# Patient Record
Sex: Female | Born: 1995 | Race: White | Hispanic: No | Marital: Single | Smoking: Never smoker
Health system: Southern US, Community
[De-identification: ages and names within clinical notes are randomized; demographics above are authoritative.]

## PROBLEM LIST (undated history)

## (undated) DIAGNOSIS — F32A Depression, unspecified: Secondary | ICD-10-CM

## (undated) DIAGNOSIS — F329 Major depressive disorder, single episode, unspecified: Secondary | ICD-10-CM

## (undated) DIAGNOSIS — T783XXA Angioneurotic edema, initial encounter: Secondary | ICD-10-CM

## (undated) HISTORY — PX: TYMPANOPLASTY: SHX33

## (undated) HISTORY — DX: Angioneurotic edema, initial encounter: T78.3XXA

## (undated) HISTORY — PX: TONSILLECTOMY: SUR1361

## (undated) HISTORY — PX: ADENOIDECTOMY: SUR15

---
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
## 2015-05-21 ENCOUNTER — Ambulatory Visit (INDEPENDENT_AMBULATORY_CARE_PROVIDER_SITE_OTHER): Payer: BLUE CROSS/BLUE SHIELD | Admitting: Cardiology

## 2015-05-21 ENCOUNTER — Encounter: Payer: Self-pay | Admitting: *Deleted

## 2015-05-21 ENCOUNTER — Ambulatory Visit (INDEPENDENT_AMBULATORY_CARE_PROVIDER_SITE_OTHER): Payer: BLUE CROSS/BLUE SHIELD

## 2015-05-21 ENCOUNTER — Encounter: Payer: Self-pay | Admitting: Cardiology

## 2015-05-21 VITALS — BP 108/70 | HR 87 | Ht 65.0 in | Wt 224.2 lb

## 2015-05-21 DIAGNOSIS — R55 Syncope and collapse: Secondary | ICD-10-CM

## 2015-05-21 MED ORDER — DILTIAZEM HCL ER COATED BEADS 120 MG PO CP24: 120.0000 mg | ORAL_CAPSULE | Freq: Every day | ORAL | Status: DC

## 2015-05-21 NOTE — Progress Notes (Signed)
Findings about with propanolol early was   Electrophysiology Office Note   Date:  05/21/2015   ID:  Stacy Ponce, DOB 09/29/1995, MRN [id number]  PCP:  Eartha InchBADGER,MICHAEL C, MD   Primary Electrophysiologist:  Regan LemmingWill Martin Adewale Pucillo, MD    No chief complaint on file.    History of Present Illness: Stacy Ponce is a 19 y.o. female who presents today for electrophysiology evaluation.   She is a [employer]ing [employer], he was first evaluated in October due to her dizziness, lightheadedness, and apparent syncopal events. During her visit in November, she was placed on propranolol and 8's IO patch was placed. On November 10, she became ill. Her monitor showed no significant episodes correlated with that time. She did have episodes of SVT that occurred that day but none during the time that she felt bad. She felt panicky and also hyperventilated and went to the emergency room.  She does say that she last passed out on Sunday. She says that she does not remember what happened before and but woke up him up on the ground. She says that she feels fine before the events and has no prodrome, and is disoriented after the events but that goes away quickly.   Today, she denies symptoms of palpitations, chest pain, shortness of breath, orthopnea, PND, lower extremity edema, claudication, dizziness, presyncope, syncope, bleeding, or neurologic sequela. The patient is tolerating medications without difficulties and is otherwise without complaint today.    Past Medical History  Diagnosis Date  . Depression    Past Surgical History  Procedure Laterality Date  . Tympanoplasty       Current Outpatient Prescriptions  Medication Sig Dispense Refill  . propranolol (INDERAL) 20 MG tablet Take 20 mg by mouth daily.  5   No current facility-administered medications for this visit.    Allergies:   Review of patient's allergies indicates no known allergies.   Social History:  The  patient  reports that she has been smoking.  She has never used smokeless tobacco. She reports that she does not drink alcohol.   Family History:  The patient's family history includes Diabetes in her sister; Heart attack in her father; Heart disease in her father; Leukemia in her mother.    ROS:  Please see the history of present illness.   Otherwise, review of systems is positive for general he is in the mother is is.   All other systems are reviewed and negative.    PHYSICAL EXAM: VS:  There were no vitals taken for this visit. , BMI There is no height or weight on file to calculate BMI. GEN: Well nourished, well developed, in no acute distress HEENT: normal Neck: no JVD, carotid bruits, or masses Cardiac: she is nowillness.RRR; no murmurs, rubs, or gallops,no edema  Respiratory:  clear to auscultation bilaterally, normal work of breathing GI: soft, nontender, nondistended, + BS MS: no deformity or atrophy Skin: warm and dry Neuro:  Strength and sensation are intact Psych: euthymic mood, full affect  EKG:  EKG is ordered today. The ekg ordered today shows sinus rhythm, rate 87  Recent Labs: No results found for requested labs within last 365 days.    Lipid Panel  No results found for: CHOL, TRIG, HDL, CHOLHDL, VLDL, LDLCALC, LDLDIRECT   Wt Readings from Last 3 Encounters:  08/01/12 207 lb 3.2 oz (93.985 kg) (98 %*, Z = 2.10)   * Growth percentiles are based on CDC 2-20 Years data.  Other studies Reviewed: Additional studies/ records that were reviewed today include: TTE Review of the above records today demonstrates:  EF 60-65% and no valvular heart disease.   ASSESSMENT AND PLAN:  1.  Syncope: patient has been passing out for the last 2 months. She did wear a monitor which showed possibly sinus tachycardia versus SVT. I have offered her EP study versus repeat monitor to further characterize her heart rhythm. She would like to put on a monitor and see if there  are any abnormalities on this. I Cayman Kielbasa also switch her propranolol to diltiazem as this is a daily medication and she does not have to take it as often. Due to the fact that she has passed out I have told her that she is, by [facility], not able to drive for 6 months unless a cause of her syncope is found. We Loron Weimer have her follow-up after the monitor.    Current medicines are reviewed at length with the patient today.   The patient does not have concerns regarding her medicines.  The following changes were made today:  Switch propranolol to diltiazem  Labs/ tests ordered today include:  No orders of the defined types were placed in this encounter.     Disposition:   FU with Birtie Fellman 6 weeks  Signed, Hermes Wafer Jorja Loa, MD  05/21/2015 10:36 AM     [facility] [address] [place] [address] [phone] (office) [phone] (fax)

## 2015-05-21 NOTE — Patient Instructions (Addendum)
Medication Instructions:  Your physician has recommended you make the following change in your medication: 1) START Diltiazem 120 mg daily  Labwork: None ordered  Testing/Procedures: Your physician has recommended that you wear an event monitor. Event monitors are medical devices that record the heart's electrical activity. Doctors most often us these monitors to diagnose arrhythmias. Arrhythmias are problems with the speed or rhythm of the heartbeat. The monitor is a small, portable device. You can wear one while you do your normal daily activities. This is usually used to diagnose what is causing palpitations/syncope (passing out).  Follow-Up: Your physician recommends that you schedule a follow-up appointment in: 6-8 weeks with Dr. Elberta Fortisamnitz.  If you need a refill on your cardiac medications before your next appointment, please call your pharmacy.  Thank you for choosing [facility]!!   Dory HornSherri Jonita Hirota, RN [phone]  Cardiac Event Monitoring A cardiac event monitor is a small recording device used to help detect abnormal heart rhythms (arrhythmias). The monitor is used to record heart rhythm when noticeable symptoms such as the following occur:  Fast heartbeats (palpitations), such as heart racing or fluttering.  Dizziness.  Fainting or light-headedness.  Unexplained weakness. The monitor is wired to two electrodes placed on your chest. Electrodes are flat, sticky disks that attach to your skin. The monitor can be worn for up to 30 days. You will wear the monitor at all times, except when bathing.  HOW TO USE YOUR CARDIAC EVENT MONITOR A technician will prepare your chest for the electrode placement. The technician will show you how to place the electrodes, how to work the monitor, and how to replace the batteries. Take time to practice using the monitor before you leave the office. Make sure you understand how to send the information from the monitor to your health care  provider. This requires a telephone with a landline, not a cell phone. You need to:  Wear your monitor at all times, except when you are in water:  Do not get the monitor wet.  Take the monitor off when bathing. Do not swim or use a hot tub with it on.  Keep your skin clean. Do not put body lotion or moisturizer on your chest.  Change the electrodes daily or any time they stop sticking to your skin. You might need to use tape to keep them on.  It is possible that your skin under the electrodes could become irritated. To keep this from happening, try to put the electrodes in slightly different places on your chest. However, they must remain in the area under your left breast and in the upper right section of your chest.  Make sure the monitor is safely clipped to your clothing or in a location close to your body that your health care provider recommends.  Press the button to record when you feel symptoms of heart trouble, such as dizziness, weakness, light-headedness, palpitations, thumping, shortness of breath, unexplained weakness, or a fluttering or racing heart. The monitor is always on and records what happened slightly before you pressed the button, so do not worry about being too late to get good information.  Keep a diary of your activities, such as walking, doing chores, and taking medicine. It is especially important to note what you were doing when you pushed the button to record your symptoms. This will help your health care provider determine what might be contributing to your symptoms. The information stored in your monitor will be reviewed by your health care  provider alongside your diary entries.  Send the recorded information as recommended by your health care provider. It is important to understand that it will take some time for your health care provider to process the results.  Change the batteries as recommended by your health care provider. SEEK IMMEDIATE MEDICAL CARE IF:     You have chest pain.  You have extreme difficulty breathing or shortness of breath.  You develop a very fast heartbeat that persists.  You develop dizziness that does not go away.  You faint or constantly feel you are about to faint.   This information is not intended to replace advice given to you by your health care provider. Make sure you discuss any questions you have with your health care provider.   Document Released: 03/02/2008 Document Revised: 06/14/2014 Document Reviewed: 11/20/2012 Elsevier Interactive Patient Education [employer]

## 2015-06-16 ENCOUNTER — Telehealth: Payer: Self-pay | Admitting: *Deleted

## 2015-06-16 NOTE — Telephone Encounter (Signed)
Monitor report received from 06/14/15. Reviewed with Kathi LudwigLori Gerhard FLEX APP today. Rec patient have sooner f/u with Dr. Elberta Fortisamnitz. Print out left with Dr. Gershon Craneamnitz's nurse.  Called patient. She has not had any recent syncopal episodes. Appointment rescheduled for next Monday 06/23/15 with Dr. Elberta Fortisamnitz.

## 2015-06-23 ENCOUNTER — Encounter: Payer: Self-pay | Admitting: Cardiology

## 2015-06-23 ENCOUNTER — Ambulatory Visit (INDEPENDENT_AMBULATORY_CARE_PROVIDER_SITE_OTHER): Payer: BLUE CROSS/BLUE SHIELD | Admitting: Cardiology

## 2015-06-23 VITALS — BP 120/70 | HR 103 | Ht 65.0 in | Wt 232.4 lb

## 2015-06-23 DIAGNOSIS — I471 Supraventricular tachycardia: Secondary | ICD-10-CM

## 2015-06-23 NOTE — Patient Instructions (Signed)
Medication Instructions:  Your physician recommends that you continue on your current medications as directed. Please refer to the Current Medication list given to you today.  Labwork: None ordered  Testing/Procedures: Your physician has recommended that you have a tilt table test. This test is sometimes used to help determine the cause of fainting spells. You lie on a table that moves from a lying down to an upright position. The change in position can bring on loss of consciousness. The doctor monitors your symptoms, heart rate, EKG, and blood pressure throughout the test. The doctor also may give you a medicine and then monitor your response to the medicine. This is done in the hospital and usually takes half of a day to complete the procedure.   Jeronica Stlouis, RN will call to arrange testing.   Your physician has recommended that you have an SVT ablation. Catheter ablation is a medical procedure used to treat some cardiac arrhythmias (irregular heartbeats). During catheter ablation, a long, thin, flexible tube is put into a blood vessel in your groin (upper thigh), or neck. This tube is called an ablation catheter. It is then guided to your heart through the blood vessel. Radio frequency waves destroy small areas of heart tissue where abnormal heartbeats may cause an arrhythmia to start.  Vidalia Serpas, RN will call you to arrange this, depending on what tilt table testing shows.  Follow-Up: To be determined once tilt table/ablation is arranged.  Any Other Special Instructions Will Be Listed Below (If Applicable).  If you need a refill on your cardiac medications before your next appointment, please call your pharmacy.  Thank you for choosing [facility]!!   Dory HornSherri Deklen Popelka, RN [phone]

## 2015-06-23 NOTE — Progress Notes (Signed)
Electrophysiology Office Note   Date:  06/23/2015   ID:  Stacy Ponce, DOB 1995-06-15, MRN [id number]  PCP:  Eartha Inch, MD   Primary Electrophysiologist:  Regan Lemming, MD    No chief complaint on file.    History of Present Illness: Stacy Ponce is a 20 y.o. female who presents today for electrophysiology evaluation.   She presented in December with an episode of syncope when she was walking.  She had no prodrome at that time.  She returns today after wearing a 30 day monitor. She had some dizziness while wearing the monitor but no symptoms similar to her syncope.  She was called a few times with fast heart rates but was not symptomatic during these times.  She did have an episode of SVT with HR of 170 bpm.  Today, she denies symptoms of palpitations, chest pain, shortness of breath, orthopnea, PND, lower extremity edema, claudication, dizziness, presyncope, syncope, bleeding, or neurologic sequela. The patient is tolerating medications without difficulties and is otherwise without complaint today.    Past Medical History  Diagnosis Date  . Depression    Past Surgical History  Procedure Laterality Date  . Tympanoplasty       Current Outpatient Prescriptions  Medication Sig Dispense Refill  . diltiazem (CARDIZEM CD) 120 MG 24 hr capsule Take 1 capsule (120 mg total) by mouth daily. 30 capsule 3   No current facility-administered medications for this visit.    Allergies:   Review of patient's allergies indicates no known allergies.   Social History:  The patient  reports that she has been smoking.  She has never used smokeless tobacco. She reports that she does not drink alcohol.   Family History:  The patient's family history includes Diabetes in her sister; Heart attack in her father; Heart disease in her father; Leukemia in her mother.    ROS:  Please see the history of present illness.   All other systems are reviewed and negative.     PHYSICAL EXAM: VS:  BP 120/70 mmHg  Pulse 103  Ht 5\' 5"  (1.651 m)  Wt 232 lb 6.4 oz (105.416 kg)  BMI 38.67 kg/m2  SpO2 99% , BMI Body mass index is 38.67 kg/(m^2). GEN: Well nourished, well developed, in no acute distress HEENT: normal Neck: no JVD, carotid bruits, or masses Cardiac: RRR; no murmurs, rubs, or gallops,no edema  Respiratory:  clear to auscultation bilaterally, normal work of breathing GI: soft, nontender, nondistended, + BS MS: no deformity or atrophy Skin: warm and dry Neuro:  Strength and sensation are intact Psych: euthymic mood, full affect  EKG:  EKG is not ordered today.   Recent Labs: No results found for requested labs within last 365 days.    Lipid Panel  No results found for: CHOL, TRIG, HDL, CHOLHDL, VLDL, LDLCALC, LDLDIRECT   Wt Readings from Last 3 Encounters:  06/23/15 232 lb 6.4 oz (105.416 kg) (99 %*, Z = 2.35)  05/21/15 224 lb 3.2 oz (101.696 kg) (99 %*, Z = 2.26)  08/01/12 207 lb 3.2 oz (93.985 kg) (98 %*, Z = 2.10)   * Growth percentiles are based on CDC 2-20 Years data.      Other studies Reviewed: Additional studies/ records that were reviewed today include: Event monitor 05/21/15 Review of the above records today demonstrates:  Sinus rhythm with sinus tachycardia rate 150 Episode of SVT with rates 170  ASSESSMENT AND PLAN:  1.  SVT: found on event monitor.  Unlikely the cause of her syncope as she was asymptomatic during the episode.  That being said, both her father and her would like this to be investigated, so we Arlander Gillen plan for EP study and possible ablation.  2.  Syncope: unfortunately, she had no symptoms of syncope while wearing the monitor.  She not able to drive as we have not found a reason for her episode.  She did as for a tilt table test, but I do not feel that this Aileene Lanum give the answer of her syncope as she did not have any evidence of neural mediation.  Jaksen Fiorella plan for ablation of her SVT, not able to drive for 6  months after her most recent episode of syncope.  Current medicines are reviewed at length with the patient today.   The patient does not have concerns regarding her medicines.  The following changes were made today:  none  Labs/ tests ordered today include:  No orders of the defined types were placed in this encounter.     Disposition:   FU with Rosemary Mossbarger post ablation  Signed, Cherrill Scrima Jorja LoaMartin Josias Tomerlin, MD  06/23/2015 3:37 PM     [facility] [address] [place] [address] [phone] (office) [phone] (fax)

## 2015-06-24 ENCOUNTER — Telehealth: Payer: Self-pay | Admitting: *Deleted

## 2015-06-24 ENCOUNTER — Encounter: Payer: Self-pay | Admitting: *Deleted

## 2015-06-24 NOTE — Telephone Encounter (Signed)
Called patient to inform her to obtain tilt table testing when she goes for her 2nd opinion on 07/14/15.  She wants to schedule SVT ablation for 3/3. Patient aware I will arrange and call her back.   lmtcb to review procedure instructions and schedule pre procedure lab exam.

## 2015-06-24 NOTE — Telephone Encounter (Signed)
F/u ° ° °Pt returning your call °

## 2015-06-24 NOTE — Telephone Encounter (Signed)
SVT ablation scheduled for 3/3. Pre procedure labs to be done in hospital the morning of the procedure.  Letter of instructions reviewed with patient and mailed to home address. Patient verbalized understanding and agreeable to plan.

## 2015-06-25 ENCOUNTER — Other Ambulatory Visit: Payer: Self-pay | Admitting: Cardiology

## 2015-07-01 ENCOUNTER — Telehealth: Payer: Self-pay | Admitting: Cardiology

## 2015-07-01 DIAGNOSIS — R55 Syncope and collapse: Secondary | ICD-10-CM

## 2015-07-01 NOTE — Telephone Encounter (Signed)
New message   Pt needs her referral for her Tilt test

## 2015-07-01 NOTE — Telephone Encounter (Signed)
Patient asking referral go to Roselie Awkward, MD at [facility] in [place], [place].  Office number [phone] Informed that I would take care of this referral tomorrow.

## 2015-07-11 ENCOUNTER — Ambulatory Visit: Payer: BLUE CROSS/BLUE SHIELD | Admitting: Cardiology

## 2015-07-24 NOTE — Telephone Encounter (Signed)
Followed up with patient to see how 2/6 appt went with cardiologist in [place]. Patient explains that they opted out of tilt table testing and are planning on proceeding with scheduled ablation next month with Dr. Elberta Fortis. Verified she received letter of instructions.

## 2015-08-08 ENCOUNTER — Ambulatory Visit (HOSPITAL_COMMUNITY)
Admission: RE | Admit: 2015-08-08 | Discharge: 2015-08-08 | Disposition: A | Discharge status: Home / Self Care | Payer: BLUE CROSS/BLUE SHIELD | Source: Ambulatory Visit | Attending: Cardiology | Admitting: Cardiology

## 2015-08-08 ENCOUNTER — Encounter (HOSPITAL_COMMUNITY)
Admission: RE | Disposition: A | Discharge status: Home / Self Care | Payer: Self-pay | Source: Ambulatory Visit | Attending: Cardiology

## 2015-08-08 DIAGNOSIS — I471 Supraventricular tachycardia: Secondary | ICD-10-CM | POA: Insufficient documentation

## 2015-08-08 DIAGNOSIS — F17210 Nicotine dependence, cigarettes, uncomplicated: Secondary | ICD-10-CM | POA: Diagnosis not present

## 2015-08-08 DIAGNOSIS — Z8249 Family history of ischemic heart disease and other diseases of the circulatory system: Secondary | ICD-10-CM | POA: Insufficient documentation

## 2015-08-08 DIAGNOSIS — F329 Major depressive disorder, single episode, unspecified: Secondary | ICD-10-CM | POA: Insufficient documentation

## 2015-08-08 HISTORY — PX: ELECTROPHYSIOLOGIC STUDY: SHX172A

## 2015-08-08 LAB — CBC
HEMATOCRIT: 34.4 % — AB (ref 36.0–46.0)
HEMOGLOBIN: 10.8 g/dL — AB (ref 12.0–15.0)
MCH: 22.6 pg — AB (ref 26.0–34.0)
MCHC: 31.4 g/dL (ref 30.0–36.0)
MCV: 72 fL — ABNORMAL LOW (ref 78.0–100.0)
Platelets: 243 10*3/uL (ref 150–400)
RBC: 4.78 MIL/uL (ref 3.87–5.11)
RDW: 14.5 % (ref 11.5–15.5)
WBC: 7 10*3/uL (ref 4.0–10.5)

## 2015-08-08 LAB — BASIC METABOLIC PANEL
Anion gap: 11 (ref 5–15)
BUN: 10 mg/dL (ref 6–20)
CHLORIDE: 105 mmol/L (ref 101–111)
CO2: 25 mmol/L (ref 22–32)
CREATININE: 0.97 mg/dL (ref 0.44–1.00)
Calcium: 9.4 mg/dL (ref 8.9–10.3)
GFR calc Af Amer: >60 mL/min (ref >60)
GFR calc non Af Amer: >60 mL/min (ref >60)
Glucose, Bld: 92 mg/dL (ref 65–99)
Potassium: 4 mmol/L (ref 3.5–5.1)
Sodium: 141 mmol/L (ref 135–145)

## 2015-08-08 LAB — PROTIME-INR
INR: 1.05 (ref 0.00–1.49)
Prothrombin Time: 13.9 seconds (ref 11.6–15.2)

## 2015-08-08 LAB — PREGNANCY, URINE: Preg Test, Ur: NEGATIVE

## 2015-08-08 SURGERY — A-FLUTTER/A-TACH/SVT ABLATION

## 2015-08-08 MED ORDER — HEPARIN (PORCINE) IN NACL 2-0.9 UNIT/ML-% IJ SOLN
INTRAMUSCULAR | Status: DC | PRN
  Administered 2015-08-08: 1000 mL

## 2015-08-08 MED ORDER — FENTANYL CITRATE (PF) 100 MCG/2ML IJ SOLN
INTRAMUSCULAR | Status: AC
  Filled 2015-08-08: qty 2

## 2015-08-08 MED ORDER — MIDAZOLAM HCL 5 MG/5ML IJ SOLN
INTRAMUSCULAR | Status: AC
  Filled 2015-08-08: qty 5

## 2015-08-08 MED ORDER — HEPARIN (PORCINE) IN NACL 2-0.9 UNIT/ML-% IJ SOLN
INTRAMUSCULAR | Status: AC
  Filled 2015-08-08: qty 500

## 2015-08-08 MED ORDER — MIDAZOLAM HCL 5 MG/5ML IJ SOLN
INTRAMUSCULAR | Status: DC | PRN
  Administered 2015-08-08 (×8): 1 mg via INTRAVENOUS

## 2015-08-08 MED ORDER — SODIUM CHLORIDE 0.9 % IV SOLN: 250.0000 mL | INTRAVENOUS | Status: DC | PRN

## 2015-08-08 MED ORDER — SODIUM CHLORIDE 0.9% FLUSH: 3.0000 mL | Freq: Two times a day (BID) | INTRAVENOUS | Status: DC

## 2015-08-08 MED ORDER — ISOPROTERENOL HCL 0.2 MG/ML IJ SOLN
INTRAMUSCULAR | Status: AC
  Filled 2015-08-08: qty 5

## 2015-08-08 MED ORDER — DILTIAZEM HCL ER COATED BEADS 120 MG PO CP24
120.0000 mg | ORAL_CAPSULE | Freq: Every day | ORAL | Status: DC
  Filled 2015-08-08: qty 1

## 2015-08-08 MED ORDER — ONDANSETRON HCL 4 MG/2ML IJ SOLN: 4.0000 mg | Freq: Four times a day (QID) | INTRAMUSCULAR | Status: DC | PRN

## 2015-08-08 MED ORDER — BUPIVACAINE HCL (PF) 0.25 % IJ SOLN
INTRAMUSCULAR | Status: AC
  Filled 2015-08-08: qty 60

## 2015-08-08 MED ORDER — FENTANYL CITRATE (PF) 100 MCG/2ML IJ SOLN
INTRAMUSCULAR | Status: DC | PRN
  Administered 2015-08-08 (×4): 25 ug via INTRAVENOUS

## 2015-08-08 MED ORDER — ISOPROTERENOL HCL 0.2 MG/ML IJ SOLN
1.0000 mg | INTRAVENOUS | Status: DC | PRN
  Administered 2015-08-08: 2 ug/min via INTRAVENOUS

## 2015-08-08 MED ORDER — ACETAMINOPHEN 325 MG PO TABS
650.0000 mg | ORAL_TABLET | ORAL | Status: DC | PRN
  Filled 2015-08-08: qty 2

## 2015-08-08 MED ORDER — BUPIVACAINE HCL (PF) 0.25 % IJ SOLN
INTRAMUSCULAR | Status: DC | PRN
  Administered 2015-08-08: 45 mL

## 2015-08-08 MED ORDER — SODIUM CHLORIDE 0.9% FLUSH: 3.0000 mL | INTRAVENOUS | Status: DC | PRN

## 2015-08-08 SURGICAL SUPPLY — 11 items
BAG SNAP BAND KOVER 36X36 (MISCELLANEOUS) ×3 IMPLANT
CATH JOSEPHSON QUAD-ALLRED 6FR (CATHETERS) ×4 IMPLANT
CATH WEBSTER BI DIR CS D-F CRV (CATHETERS) ×2 IMPLANT
PACK EP LATEX FREE (CUSTOM PROCEDURE TRAY) ×3
PACK EP LF (CUSTOM PROCEDURE TRAY) ×1 IMPLANT
PAD DEFIB LIFELINK (PAD) ×3 IMPLANT
PATCH CARTO3 (PAD) ×2 IMPLANT
SHEATH PINNACLE 6F 10CM (SHEATH) ×4 IMPLANT
SHEATH PINNACLE 7F 10CM (SHEATH) ×2 IMPLANT
SHEATH PINNACLE 8F 10CM (SHEATH) ×2 IMPLANT
SHIELD RADPAD SCOOP 12X17 (MISCELLANEOUS) ×3 IMPLANT

## 2015-08-08 NOTE — Progress Notes (Signed)
Per Dr Elberta Fortisamnitz ok for client to wait until gets home to take diltiazem

## 2015-08-08 NOTE — Progress Notes (Addendum)
Site area: Rt fem vein 517fr and 418fr/ Lt fem vein 6rx2 Site Prior to Removal:  Level 0 Pressure Applied For:20 min rt amdf lt groin Patient Status During Pull:  A/O Post Pull Site:  Level 0 rt and lt groin Post Pull Instructions Given:  Post instructions give and pt understands Post Pull Pulses Present: N/A Dressing Applied:  tegaderm and 4x4 applied to Rt and Lt groin Bedrest begins @ 15:30:00 Comments:Pt leaves cath lab holding in stable condition. Rt and Lt groin unremarkable. Dressing x2 CDI.

## 2015-08-08 NOTE — H&P (Signed)
Date:  08/08/2015   ID:  Stacy Ponce, DOB Mar 04, 1996, MRN [id number]  PCP:  Stacy Inch, MD   Primary Electrophysiologist:  Stacy Lemming, MD    No chief complaint on file.    History of Present Illness: Stacy Ponce is a 20 y.o. female who presents today for electrophysiology evaluation.   She presented in December with an episode of syncope when she was walking. She had no prodrome at that time. She returns today after wearing a 30 day monitor. She had some dizziness while wearing the monitor but no symptoms similar to her syncope. She was called a few times with fast heart rates but was not symptomatic during these times. She did have an episode of SVT with HR of 170 bpm.  She presents today for SVT ablation   Today, she denies symptoms of palpitations, chest pain, shortness of breath, orthopnea, PND, lower extremity edema, claudication, dizziness, presyncope, syncope, bleeding, or neurologic sequela. The patient is tolerating medications without difficulties and is otherwise without complaint today.    Past Medical History  Diagnosis Date  . Depression    Past Surgical History  Procedure Laterality Date  . Tympanoplasty       No current facility-administered medications for this encounter.    Allergies:   Review of patient's allergies indicates no known allergies.   Social History:  The patient  reports that she has been smoking.  She has never used smokeless tobacco. She reports that she does not drink alcohol.   Family History:  The patient's family history includes Diabetes in her sister; Heart attack in her father; Heart disease in her father; Leukemia in her mother.    ROS:  Please see the history of present illness.   All other systems are reviewed and negative.    PHYSICAL EXAM: VS:  BP 138/85 mmHg  Pulse 84  Temp(Src) 98.3 F (36.8 C) (Oral)  Resp 18  Ht  (1.676 m)  Wt 270 lb (122.471 kg)  BMI 43.60 kg/m2  SpO2 100%   LMP 07/20/2015 (Exact Date) , BMI Body mass index is 43.6 kg/(m^2). GEN: Well nourished, well developed, in no acute distress HEENT: normal Neck: no JVD, carotid bruits, or masses Cardiac: RRR; no murmurs, rubs, or gallops,no edema  Respiratory:  clear to auscultation bilaterally, normal work of breathing GI: soft, nontender, nondistended, + BS MS: no deformity or atrophy Skin: warm and dry Neuro:  Strength and sensation are intact Psych: euthymic mood, full affect  Recent Labs: 08/08/2015: BUN 10; Creatinine, Ser 0.97; Hemoglobin 10.8*; Platelets 243; Potassium 4.0; Sodium 141    Lipid Panel  No results found for: CHOL, TRIG, HDL, CHOLHDL, VLDL, LDLCALC, LDLDIRECT   Wt Readings from Last 3 Encounters:  08/08/15 270 lb (122.471 kg)  06/23/15 232 lb 6.4 oz (105.416 kg) (99 %*, Z = 2.35)  05/21/15 224 lb 3.2 oz (101.696 kg) (99 %*, Z = 2.26)   * Growth percentiles are based on CDC 2-20 Years data.     ASSESSMENT AND PLAN:  1.  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  Stacy Ponce plan for ablation today.  Explained risks and benefits.  Risks include bleeding, infection, tamponade, pneumothorax.  Patient and family understand risks and wish to proceed with ablation.  Labs/ tests ordered today include:  Orders Placed This Encounter  Procedures  . CBC  . Basic metabolic panel  . Protime-INR  . Pregnancy, urine  . Diet NPO time  specified Except for: Sips with Meds  . Informed consent details: transcribe and obtain patient signature  . Clip right and left femoral area PM before surgery  . Clip right internal jugular area PM before surgery  . Verify informed consent  . Void on call to EP Lab  . Lab instructions  . AM of procedure hold 70/30 insulin dose (if ordered)  . AM of procedure hold oral hypoglycemic agents (if ordered)  . If patient on AM basal insulin: AM ON DAY OF PROCEDURE:  Give 1/2 basal dose (Lantus, Levemir, or NPH)  . EP Study  . Insert peripheral IV      Signed, Maxx Pham Jorja LoaMartin Valene Villa, MD  08/08/2015 11:59 AM     [facility] [address] [place] [address] [phone] (office) [phone] (fax)

## 2015-08-08 NOTE — Discharge Instructions (Signed)
No driving for 4 days. No lifting over 5 lbs for 1 week. No sexual activity for 1 week. You may return to work on 08/15/15. Keep procedure site clean & dry. If you notice increased pain, swelling, bleeding or pus, call/return!  You may shower, but no soaking baths/hot tubs/pools for 1 week.

## 2015-08-09 ENCOUNTER — Telehealth: Payer: Self-pay | Admitting: Physician Assistant

## 2015-08-09 NOTE — Telephone Encounter (Signed)
I spoke to the patient's father who said his daughter was experiencing mild numbness in her leg.  She was outside when I called so it is not disrupting her activity level.  Both feet are the same temperature.  I reassured him.  If there is a change in temperature or pain develops, he will bring her to the ER.   Wilburt FinlayHAGER, Sherine Cortese PAC

## 2015-08-11 ENCOUNTER — Telehealth: Payer: Self-pay | Admitting: Cardiology

## 2015-08-11 ENCOUNTER — Encounter: Payer: Self-pay | Admitting: *Deleted

## 2015-08-11 ENCOUNTER — Encounter (HOSPITAL_COMMUNITY): Payer: Self-pay | Admitting: Cardiology

## 2015-08-11 NOTE — Telephone Encounter (Signed)
Informed patient that I would be more than happy to supply school/work excuse letter. Informed that I would e-mail letter today.  Pt verbalized understanding.

## 2015-08-11 NOTE — Telephone Encounter (Signed)
New Message  Pt recently had an ablation. Request an excuse letter for school.  Please email it to: [email or web address]

## 2015-09-05 ENCOUNTER — Encounter: Payer: BLUE CROSS/BLUE SHIELD | Admitting: Nurse Practitioner

## 2015-10-10 ENCOUNTER — Telehealth: Payer: Self-pay | Admitting: Cardiology

## 2015-10-10 NOTE — Telephone Encounter (Signed)
New message      The NP-Virginia Rubye OaksDailey is wanting to make sure it is ok to start the pt on new medication Adderall XR, the NP asked if someone would return her phone call to the number provided

## 2015-10-10 NOTE — Telephone Encounter (Signed)
Informed Roxy CedarVirginia Dailey, NP that patient has not had post ablation follow up with physician to determine how she is doing since procedure.  Explained that I would address this with Dr. Elberta Fortisamnitz and patient next week and then inform her of plan of care.  She understands I will keep her updated with plan and if/when Dr. Elberta Fortisamnitz feels pt can begin medication.

## 2015-10-13 NOTE — Telephone Encounter (Signed)
Will address at OV w/ Camnitz on  5/16

## 2015-10-20 NOTE — Progress Notes (Signed)
Electrophysiology Office Note   Date:  10/21/2015   ID:  Stacy Ponce, DOB 27-Jan-1996, MRN [id number]  PCP:  Eartha Inch, MD   Primary Electrophysiologist:  Regan Lemming, MD    Chief Complaint  Patient presents with  . Follow-up    post ablation     History of Present Illness: Stacy Ponce is a 20 y.o. female who presents today for electrophysiology evaluation.   She is a Consulting civil engineer at [employer], he was first evaluated in October due to her dizziness, lightheadedness, and apparent syncopal events. During her visit in November, she was placed on propranolol and 8's IO patch was placed. On November 10, she became ill. Her monitor showed no significant episodes correlated with that time. She did have episodes of SVT that occurred that day but none during the time that she felt bad. She felt panicky and also hyperventilated and went to the emergency room.  She does say that she last passed in early December. She had a negative EP study on 08/08/15.     Today, she denies symptoms of palpitations, chest pain, shortness of breath, orthopnea, PND, lower extremity edema, claudication, dizziness, presyncope, syncope, bleeding, or neurologic sequela. The patient is tolerating medications without difficulties and is otherwise without complaint today.   Past Medical History  Diagnosis Date  . Depression    Past Surgical History  Procedure Laterality Date  . Tympanoplasty    . Electrophysiologic study N/A 08/08/2015    Procedure: SVT Ablation;  Surgeon: Will Jorja Loa, MD;  Location: [facility];  Service: Cardiovascular;  Laterality: N/A;     Current Outpatient Prescriptions  Medication Sig Dispense Refill  . diltiazem (CARDIZEM CD) 120 MG 24 hr capsule Take 1 capsule (120 mg total) by mouth daily. 30 capsule 3  . sertraline (ZOLOFT) 50 MG tablet Take 50 mg by mouth daily.  0   No current facility-administered medications for this visit.     Allergies:   Review of patient's allergies indicates no known allergies.   Social History:  The patient  reports that she has been smoking.  She has never used smokeless tobacco. She reports that she does not drink alcohol.   Family History:  The patient's family history includes Diabetes in her sister; Heart attack in her father; Heart disease in her father; Leukemia in her mother.    ROS:  Please see the history of present illness.   Otherwise, review of systems is positive none.   All other systems are reviewed and negative.    PHYSICAL EXAM: VS:  Ht  (1.651 m)  Wt 240 lb 6.4 oz (109.045 kg)  BMI 40.00 kg/m2 , BMI Body mass index is 40 kg/(m^2). GEN: Well nourished, well developed, in no acute distress HEENT: normal Neck: no JVD, carotid bruits, or masses Cardiac: RRR; no murmurs, rubs, or gallops,no edema  Respiratory:  clear to auscultation bilaterally, normal work of breathing GI: soft, nontender, nondistended, + BS MS: no deformity or atrophy Skin: warm and dry Neuro:  Strength and sensation are intact Psych: euthymic mood, full affect  EKG:  EKG is ordered today. The ekg ordered today shows sinus rhythm, rate 85  Recent Labs: 08/08/2015: BUN 10; Creatinine, Ser 0.97; Hemoglobin 10.8*; Platelets 243; Potassium 4.0; Sodium 141    Lipid Panel  No results found for: CHOL, TRIG, HDL, CHOLHDL, VLDL, LDLCALC, LDLDIRECT   Wt Readings from Last 3 Encounters:  10/21/15 240 lb 6.4 oz (109.045 kg)  08/08/15 270 lb (122.471 kg)  06/23/15 232 lb 6.4 oz (105.416 kg) (99 %*, Z = 2.35)   * Growth percentiles are based on CDC 2-20 Years data.      Other studies Reviewed: Additional studies/ records that were reviewed today include: TTE Review of the above records today demonstrates:  EF 60-65% and no valvular heart disease.   ASSESSMENT AND PLAN:  1.  Syncope: patient has been passing out for the last 2 months. She did wear a monitor which showed possibly sinus  tachycardia versus SVT. She had an EP study on 08/08/15 which showed no inducible arrhythmia.She is not had any further syncope since her episode back in December. Due to that, it has been 6 months since her most recent syncope and she is now able to drive [place]. I have told her that if she has any further issues to call the office back and I would be more than happy to see her at that time.  There is a question of her being on Adderall for ADHD. This is likely not a complicating medication at this time.   Current medicines are reviewed at length with the patient today.   The patient does not have concerns regarding her medicines.  The following changes were made today:    Labs/ tests ordered today include:  No orders of the defined types were placed in this encounter.     Disposition:   FU with Will Camnitz as needed  Signed, Will Jorja LoaMartin Camnitz, MD  10/21/2015 12:09 PM     [facility] [address] [place] [address] [phone] (office) [phone] (fax)

## 2015-10-21 ENCOUNTER — Encounter: Payer: Self-pay | Admitting: Cardiology

## 2015-10-21 ENCOUNTER — Ambulatory Visit (INDEPENDENT_AMBULATORY_CARE_PROVIDER_SITE_OTHER): Payer: BLUE CROSS/BLUE SHIELD | Admitting: Cardiology

## 2015-10-21 VITALS — BP 110/90 | HR 85 | Ht 65.0 in | Wt 240.4 lb

## 2015-10-21 DIAGNOSIS — I471 Supraventricular tachycardia: Secondary | ICD-10-CM | POA: Diagnosis not present

## 2015-10-21 NOTE — Patient Instructions (Signed)
Medication Instructions:  Your physician recommends that you continue on your current medications as directed. Please refer to the Current Medication list given to you today.  If you need a refill on your cardiac medications before your next appointment, please call your pharmacy.  Labwork: None ordered  Testing/Procedures: None ordered  Follow-Up: No follow up is needed at this time with Dr. Elberta Fortisamnitz.  He will see you on an as needed basis.  Any Other Special Instructions Will Be Listed Below (If Applicable). You may start taking Adderall XR

## 2015-10-21 NOTE — Telephone Encounter (Signed)
Patient seen by Dr. Elberta Fortisamnitz today -- ok to start Adderall. Called and left message at [place]ia Dailey, NP office informing her of approval.

## 2017-10-21 ENCOUNTER — Encounter: Payer: Self-pay | Admitting: Allergy

## 2017-10-21 ENCOUNTER — Ambulatory Visit (INDEPENDENT_AMBULATORY_CARE_PROVIDER_SITE_OTHER): Payer: BLUE CROSS/BLUE SHIELD | Admitting: Allergy

## 2017-10-21 VITALS — BP 132/88 | HR 119 | Temp 98.3°F | Resp 20 | Ht 65.4 in | Wt 216.4 lb

## 2017-10-21 DIAGNOSIS — J3081 Allergic rhinitis due to animal (cat) (dog) hair and dander: Secondary | ICD-10-CM | POA: Diagnosis not present

## 2017-10-21 DIAGNOSIS — T783XXA Angioneurotic edema, initial encounter: Secondary | ICD-10-CM | POA: Diagnosis not present

## 2017-10-21 NOTE — Patient Instructions (Addendum)
Allergic rhinitis - environmental allergy skin testing is positive to grasses, trees, molds, dust mite, cat and dog - allergen avoidance measures discussed and handouts provided.   - continue zyrtec  daily (may take twice a day with dog exposure if needed) - continue Nasacort 2 sprays each nostril daily as needed for nasal congestion. Use for 1-2 weeks at a time before stopping once symptoms improve.   - if zyrtec is not effective trial Xyzal  or Allegra .  For nasal congestion can try use of OTC Flonase or Rhinocort.  - allergen immunotherapy discussed today including protocol, benefits and risk.  Informational handout provided.  If interested in this therapuetic option you can check with your insurance carrier for coverage.  Let us know if you would like to proceed with this option.    Angioedema (lip swelling)   - at this time etiology of swelling is unknown.  Swelling can be caused by a variety of different triggers including illness/infection, foods, medications, stings to name a few however sometimes there is no identifiable trigger.  Should lip swelling recur or new symptoms occur, a journal is to be kept recording any foods eaten, beverages consumed, medications taken, activities performed, and environmental conditions within a 6 hour time period prior to the onset of symptoms.    - recommend taking additional antihistamine if lip swelling occurs either benadryl or extra zyrtec.    Follow-up 4-6 months or sooner if needed

## 2017-10-21 NOTE — Progress Notes (Signed)
New Patient Note  RE: Stacy Ponce MRN: [id number] DOB: 1995/10/18 Date of Office Visit: 10/21/2017  Referring provider: Eartha Inch, MD Primary care provider: Eartha Inch, MD  Chief Complaint: allergies   History of present illness: Stacy Ponce is a 22 y.o. female presenting today for consultation for allergies.    She belives she is allergic to her parents dog.   She reports having nasal congestion and runny nose as well as sneezing and facial itch when she is at her parents home like during vacations/breaks from college.  She states when she is at college she does not have these allergy symptoms but reports when she is home from college she is very symptomatic.  She uses zyrtec and Nasacort which does help with her symptoms.   She had isolated lip swelling several weeks ago while at school.  She took benadryl to help which did help decrease the swelling.  She states after about 4 hours her swelling had improved.  No difficulty breathing or swallowing.  She denies any visible rash, no GI, respiratory or CV related symptoms.  She states there's a slight chance she was bit/stung during night but she did not see any bite marks.  Not concerned for food as states she noticed in the morning.  She has not had any swelling episodes prior to this.     No history food allergy, eczema or ashtma.    Review of systems: Review of Systems  Constitutional: Negative for chills, fever and malaise/fatigue.  HENT: Positive for congestion. Negative for ear discharge, ear pain, nosebleeds, sinus pain and sore throat.   Eyes: Negative for pain, discharge and redness.  Respiratory: Negative for cough, shortness of breath and wheezing.   Cardiovascular: Negative for chest pain.  Gastrointestinal: Negative for abdominal pain, constipation, diarrhea, heartburn, nausea and vomiting.  Musculoskeletal: Negative for joint pain.  Skin: Positive for itching. Negative for rash.    Neurological: Negative for headaches.    All other systems negative unless noted above in HPI  Past medical history: Past Medical History:  Diagnosis Date  . Angio-edema   . Depression     Past surgical history: Past Surgical History:  Procedure Laterality Date  . ADENOIDECTOMY    . ELECTROPHYSIOLOGIC STUDY N/A 08/08/2015   Procedure: SVT Ablation;  Surgeon: Will Jorja Loa, MD;  Location: [facility];  Service: Cardiovascular;  Laterality: N/A;  . TONSILLECTOMY    . TYMPANOPLASTY      Family history:  Family History  Problem Relation Age of Onset  . Leukemia Mother   . Heart attack Father   . Heart disease Father   . Diabetes Sister   . 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  She lives in an apartment while at school with carpeting with electric heating and window cooling.  No pets in apartment.  See HPI.   She works as a Biomedical scientist.  Denies smoking history.    Medication List: Allergies as of 10/21/2017   No Known Allergies     Medication List        Accurate as of 10/21/17  3:36 PM. Always use your most recent med list.          ADDERALL XR 30 MG 24 hr capsule Generic drug:  amphetamine-dextroamphetamine TK 1 C PO QAM   cetirizine 10 MG tablet Commonly known as:  ZYRTEC  Take 10 mg by mouth daily as needed for allergies.       Known medication allergies: No Known Allergies   Physical examination: Blood pressure 132/88, pulse (!) 119, temperature 98.3 F (36.8 C), temperature source Oral, resp. rate 20, height 5' 5.4" (1.661 m), weight 216 lb 6.4 oz (98.2 kg).  General: Alert, interactive, in no acute distress. HEENT: PERRLA, TMs pearly gray, turbinates moderately edematous with clear discharge, post-pharynx non erythematous. Neck: Supple without lymphadenopathy. Lungs: Clear to auscultation without wheezing, rhonchi or rales. {no increased work of  breathing. CV: Normal S1, S2 without murmurs. Abdomen: Nondistended, nontender. Skin: Warm and dry, without lesions or rashes. Extremities:  No clubbing, cyanosis or edema. Neuro:   Grossly intact.  Diagnositics/Labs:  Allergy testing: environmental allergy skin prick testing is positive to grasses, cedar, alternaria, aspergillus, phoma betae, d. Pter, cat.  Intradermal testing is positive to tree mix, mold mix 3 and 4, dog Allergy testing results were read and interpreted by provider, documented by clinical staff.   Assessment and plan:   Allergic rhinitis - environmental allergy skin testing is positive to grasses, trees, molds, dust mite, cat and dog - allergen avoidance measures discussed and handouts provided.   - continue zyrtec  daily (may take twice a day with dog exposure if needed) - continue Nasacort 2 sprays each nostril daily as needed for nasal congestion. Use for 1-2 weeks at a time before stopping once symptoms improve.   - if zyrtec is not effective trial Xyzal  or Allegra .  For nasal congestion can try use of OTC Flonase or Rhinocort.  - allergen immunotherapy discussed today including protocol, benefits and risk.  Informational handout provided.  If interested in this therapuetic option you can check with your insurance carrier for coverage.  Let us know if you would like to proceed with this option.    Angioedema (lip swelling)   - at this time etiology of swelling is unknown.  Swelling can be caused by a variety of different triggers including illness/infection, foods, medications, stings to name a few however sometimes there is no identifiable trigger.  Should lip swelling recur or new symptoms occur, a journal is to be kept recording any foods eaten, beverages consumed, medications taken, activities performed, and environmental conditions within a 6 hour time period prior to the onset of symptoms.  Not concerned for medication reaction as not on any  medications known to cause angioedema.  If becomes recurrent would recommend a HAE evaluation.  May also warrant alpha gal evaluation if symptoms recur.     - recommend taking additional antihistamine if lip swelling occurs either benadryl or extra zyrtec.    Follow-up 4-6 months or sooner if needed  I appreciate the opportunity to take part in Tumeka's care. Please do not hesitate to contact me with questions.  Sincerely,   Margo Aye, MD Allergy/Immunology [facility]

## 2018-03-20 ENCOUNTER — Ambulatory Visit: Payer: BLUE CROSS/BLUE SHIELD | Admitting: Allergy
